# Patient Record
Sex: Male | Born: 1955 | Race: White | Hispanic: No
Health system: Southern US, Community
[De-identification: ages and names within clinical notes are randomized; demographics above are authoritative.]

---

## 2012-03-31 DIAGNOSIS — L219 Seborrheic dermatitis, unspecified: Secondary | ICD-10-CM | POA: Insufficient documentation

## 2012-09-22 DIAGNOSIS — I10 Essential (primary) hypertension: Secondary | ICD-10-CM | POA: Insufficient documentation

## 2012-09-22 DIAGNOSIS — L570 Actinic keratosis: Secondary | ICD-10-CM | POA: Insufficient documentation

## 2016-06-06 DIAGNOSIS — R209 Unspecified disturbances of skin sensation: Secondary | ICD-10-CM

## 2016-06-06 DIAGNOSIS — R739 Hyperglycemia, unspecified: Secondary | ICD-10-CM | POA: Insufficient documentation

## 2016-06-06 DIAGNOSIS — IMO0001 Reserved for inherently not codable concepts without codable children: Secondary | ICD-10-CM | POA: Insufficient documentation

## 2016-10-24 DIAGNOSIS — D472 Monoclonal gammopathy: Secondary | ICD-10-CM | POA: Insufficient documentation

## 2017-02-10 DIAGNOSIS — D899 Disorder involving the immune mechanism, unspecified: Secondary | ICD-10-CM | POA: Insufficient documentation

## 2017-02-10 DIAGNOSIS — G909 Disorder of the autonomic nervous system, unspecified: Secondary | ICD-10-CM

## 2017-03-06 DIAGNOSIS — G629 Polyneuropathy, unspecified: Secondary | ICD-10-CM | POA: Insufficient documentation

## 2017-04-16 DIAGNOSIS — G63 Polyneuropathy in diseases classified elsewhere: Secondary | ICD-10-CM | POA: Insufficient documentation

## 2017-04-16 DIAGNOSIS — I776 Arteritis, unspecified: Secondary | ICD-10-CM | POA: Insufficient documentation

## 2017-07-18 DIAGNOSIS — R7303 Prediabetes: Secondary | ICD-10-CM | POA: Insufficient documentation

## 2018-04-16 ENCOUNTER — Ambulatory Visit (INDEPENDENT_AMBULATORY_CARE_PROVIDER_SITE_OTHER): Payer: BLUE CROSS/BLUE SHIELD

## 2018-04-16 ENCOUNTER — Encounter: Payer: Self-pay | Admitting: Podiatry

## 2018-04-16 ENCOUNTER — Other Ambulatory Visit: Payer: Self-pay | Admitting: Podiatry

## 2018-04-16 ENCOUNTER — Ambulatory Visit: Payer: BLUE CROSS/BLUE SHIELD | Admitting: Podiatry

## 2018-04-16 VITALS — BP 160/92 | HR 72

## 2018-04-16 DIAGNOSIS — R2681 Unsteadiness on feet: Secondary | ICD-10-CM

## 2018-04-16 DIAGNOSIS — M79671 Pain in right foot: Secondary | ICD-10-CM

## 2018-04-16 DIAGNOSIS — M79672 Pain in left foot: Secondary | ICD-10-CM | POA: Diagnosis not present

## 2018-04-16 DIAGNOSIS — G629 Polyneuropathy, unspecified: Secondary | ICD-10-CM | POA: Diagnosis not present

## 2018-04-16 NOTE — Progress Notes (Signed)
Subjective:   Patient ID: Benjamin Price, male   DOB: 62 y.o.   MRN: [id number]   HPI Patient presents after having significant diagnosis of idiopathic peripheral neuropathy which is gotten worse and now extends to his knee.  He has had numerous tests done which have not been able to indicate complete cause but it appears to be immune related and patient has developed significant balance issues.  Patient utilizes AFO bracing but does not feel that this gives him the stability that he needs with certain types of activities and is interested in other bracing and also has severe pain at night and balance issues and wants to know whether physical therapy could be of benefit.  Patient does not smoke and would like to be active   Review of Systems  All other systems reviewed and are negative.       Objective:  Physical Exam Vitals signs and nursing note reviewed.  Constitutional:      Appearance: He is well-developed.  Pulmonary:     Effort: Pulmonary effort is normal.  Musculoskeletal: Normal range of motion.  Skin:    General: Skin is warm.  Neurological:     Mental Status: He is alert.     Vascular status was found to be diminished DP pulses right over left with cool foot right over left which appears to be autonomic.  Patient has discoloration of his feet diminished range of motion subtalar midtarsal joint and significant diminishment of muscle strength extensor flexor muscle groups.  Patient does have an abnormal gait pattern and does have instability and does have profound loss sharp dull and vibratory.  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  At this point I have recommended consultation with ped orthotist with consideration of balance bracing or possible hinged type AFO bracing.  I have also recommended physical therapy to try to see whether  balance strengthening and exercises could be of benefit with also consideration for neurogenic's treatment to try to help with his autonomic problem.  Patient will be reevaluated after he is gone through these modalities  X-rays indicate no indications of bony pathological issues with the feet with no osteoporosis noted or indications of arthritis or any form of fracture or stress fracture condition

## 2018-05-18 ENCOUNTER — Ambulatory Visit: Payer: BLUE CROSS/BLUE SHIELD | Admitting: Orthotics

## 2018-05-18 DIAGNOSIS — G629 Polyneuropathy, unspecified: Secondary | ICD-10-CM

## 2018-05-18 DIAGNOSIS — M79671 Pain in right foot: Secondary | ICD-10-CM

## 2018-05-18 DIAGNOSIS — M79672 Pain in left foot: Secondary | ICD-10-CM

## 2018-05-18 DIAGNOSIS — R2681 Unsteadiness on feet: Secondary | ICD-10-CM

## 2018-05-19 NOTE — Progress Notes (Signed)
After consultation with both patient and his PT (Mara-Benchmark); it was determined that any sort of bracing, other than the OTS bracing he currently has, would be of little use and perhaps even counter productive.   He does have significant balance issues, however balance braces would not help with his foot drop or muscle atrophy, especially right.  He is scheduled for a nerve block and Lynn Ito will reevaluate post to determine if he would benefit from any further DME.

## 2018-05-22 ENCOUNTER — Other Ambulatory Visit (HOSPITAL_COMMUNITY): Payer: Self-pay | Admitting: Oncology

## 2018-05-22 DIAGNOSIS — G609 Hereditary and idiopathic neuropathy, unspecified: Secondary | ICD-10-CM

## 2018-05-25 ENCOUNTER — Other Ambulatory Visit (HOSPITAL_COMMUNITY): Payer: Self-pay | Admitting: Oncology

## 2018-05-25 DIAGNOSIS — G609 Hereditary and idiopathic neuropathy, unspecified: Secondary | ICD-10-CM

## 2018-05-30 ENCOUNTER — Ambulatory Visit (HOSPITAL_COMMUNITY)
Admission: RE | Admit: 2018-05-30 | Discharge: 2018-05-30 | Disposition: A | Discharge status: Home / Self Care | Payer: BLUE CROSS/BLUE SHIELD | Source: Ambulatory Visit | Attending: Oncology | Admitting: Oncology

## 2018-05-30 DIAGNOSIS — G609 Hereditary and idiopathic neuropathy, unspecified: Secondary | ICD-10-CM

## 2019-11-16 IMAGING — MR MR HEAD W/O CM
9 of 10 series · 40 of 48 positions shown · non-contrast
Comparison: None.

CLINICAL DATA: Bilateral arm pain and tingling

EXAM:
MRI HEAD WITHOUT CONTRAST
TECHNIQUE: Multiplanar, multiecho pulse sequences of the brain and surrounding
structures were obtained without intravenous contrast.

[Series 3: T1 · sagittal · 5.0mm · 0.47mm/px · 1 of 23 slices shown]
[im 1/23]
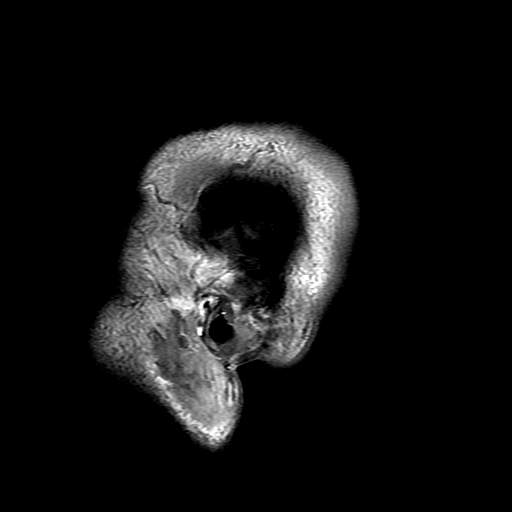

[Series 4: DWI · axial · 3.0mm · 1.09mm/px · z∈[-36,+104]mm · 8 of 96 slices shown (1 of 4)]
[im 1/96]
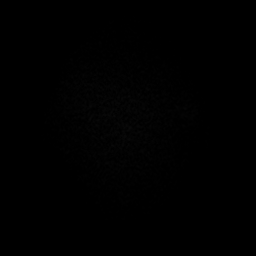
[im 11/96]
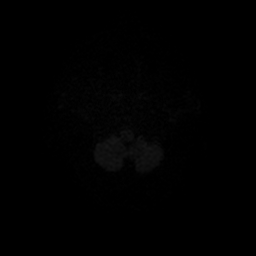
[im 32/96]
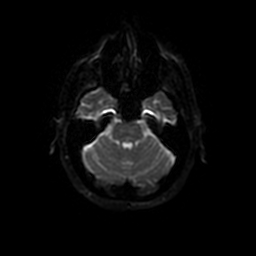
[im 43/96]
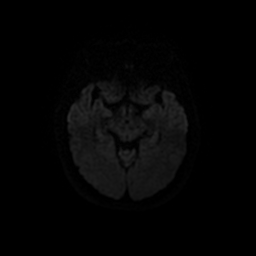
[im 53/96]
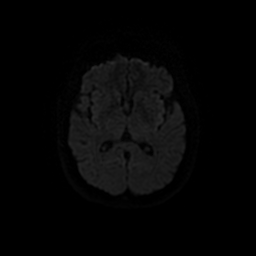
[im 64/96]
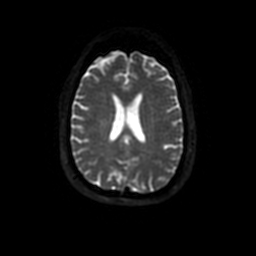
[im 85/96]
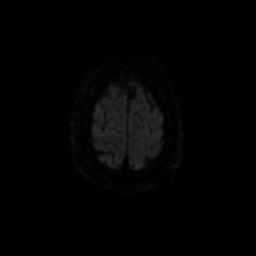
[im 96/96]
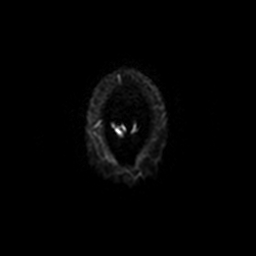

[Series 5: T2 · axial · 5.0mm · 0.43mm/px · z∈[-44,+102]mm · 2 of 22 slices shown (1 of 2)]
[im 1/22]
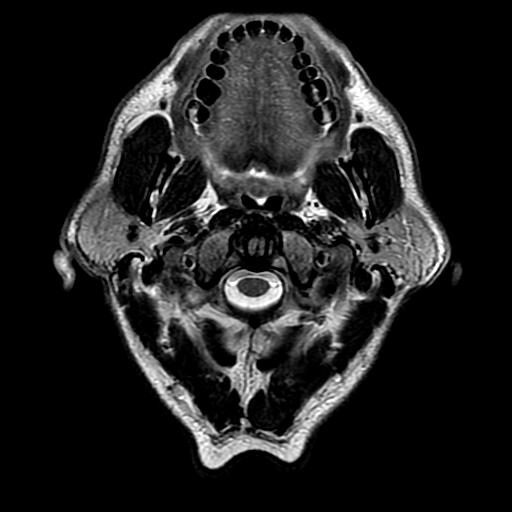
[im 22/22]
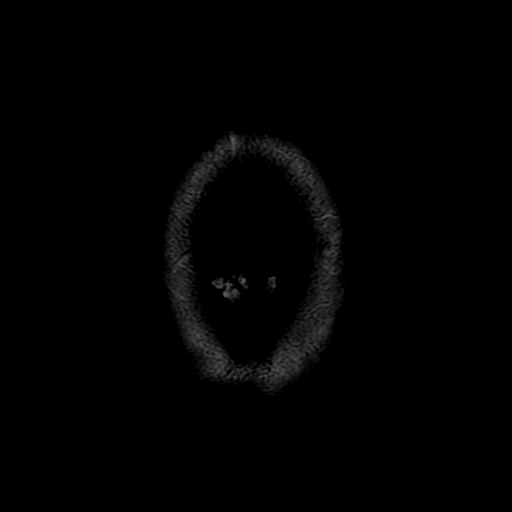

[Series 6: FLAIR · axial · 3.0mm · 0.43mm/px · z∈[-46,+103]mm · 3 of 26 slices shown]
[im 1/26]
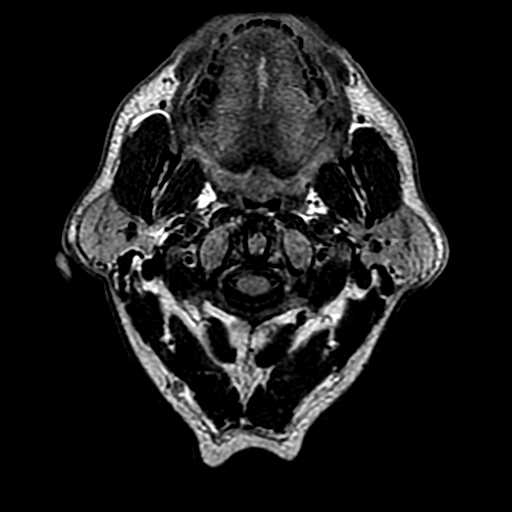
[im 13/26]
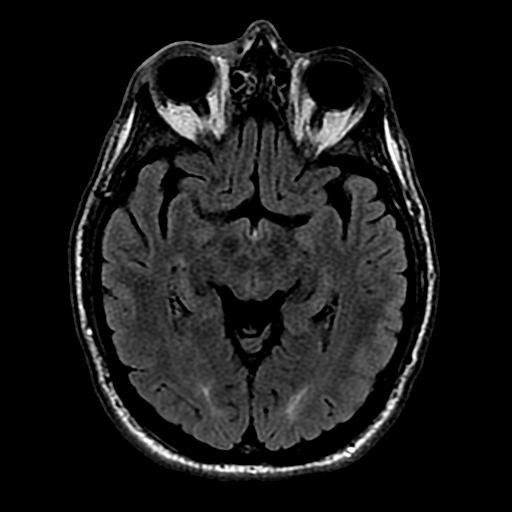
[im 26/26]
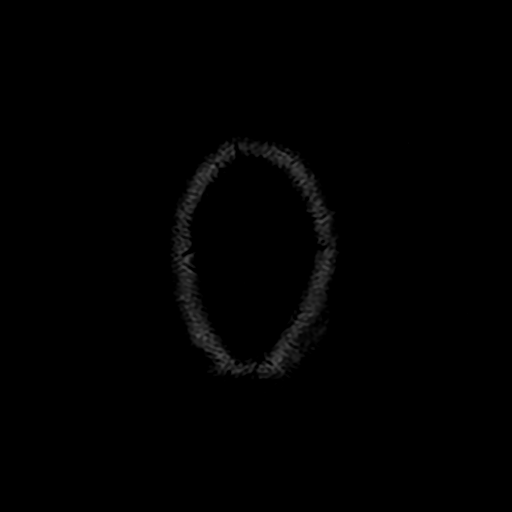

[Series 7: DWI · coronal · 3.0mm · 1.09mm/px · 11 of 112 slices shown (2 of 4)]
[im 1/112]
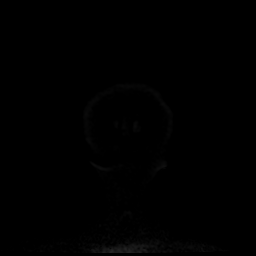
[im 12/112]
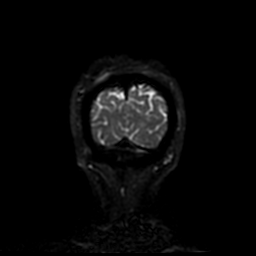
[im 23/112]
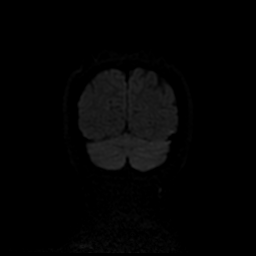
[im 34/112]
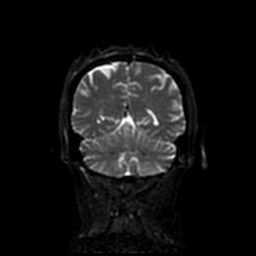
[im 45/112]
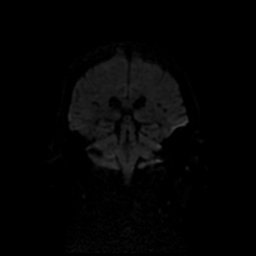
[im 56/112]
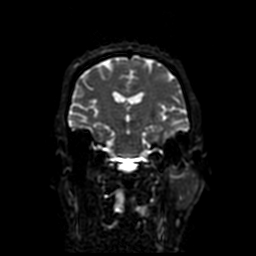
[im 67/112]
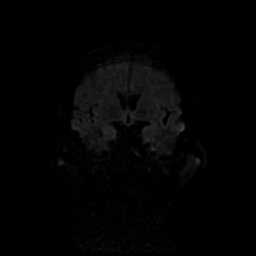
[im 78/112]
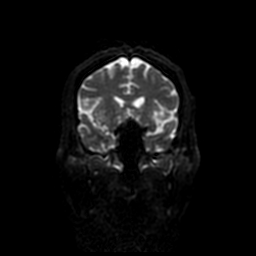
[im 89/112]
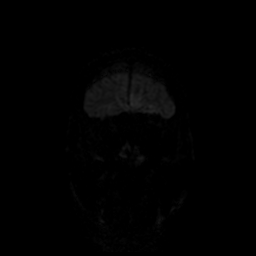
[im 100/112]
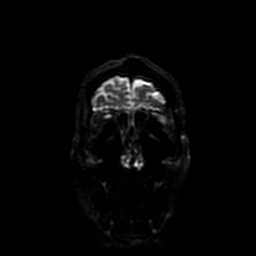
[im 112/112]
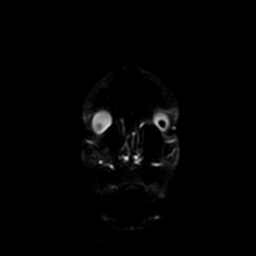

[Series 8: ax mpgr · axial · 5.0mm · 0.43mm/px · 1 of 22 slices shown]
[im 1/22]
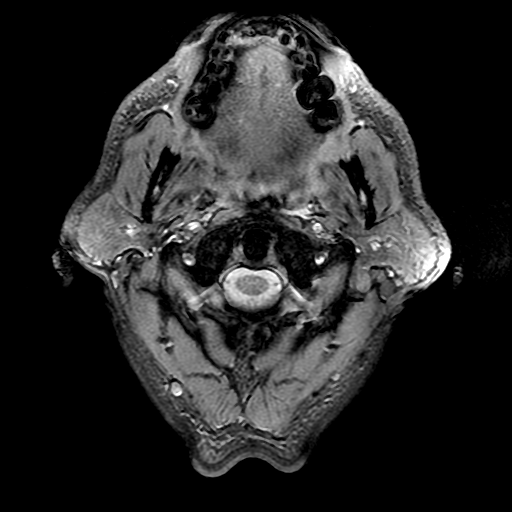

[Series 10: T2 · coronal · 5.0mm · 0.45mm/px · 3 of 26 slices shown (2 of 2)]
[im 1/26]
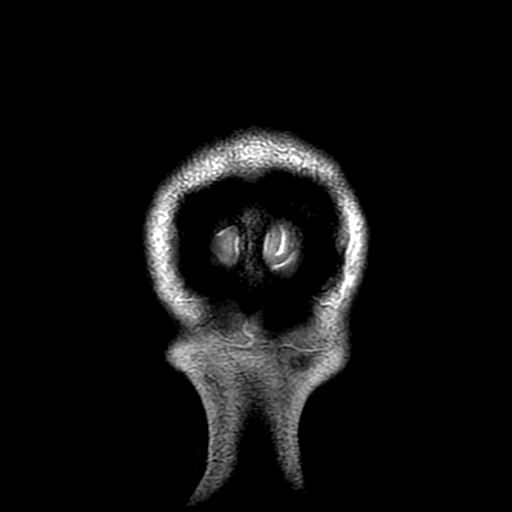
[im 13/26]
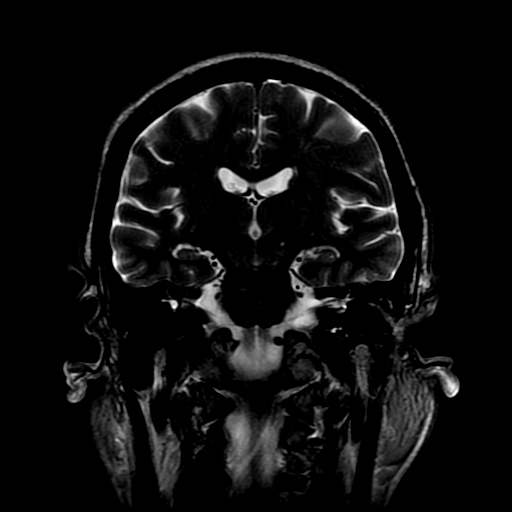
[im 26/26]
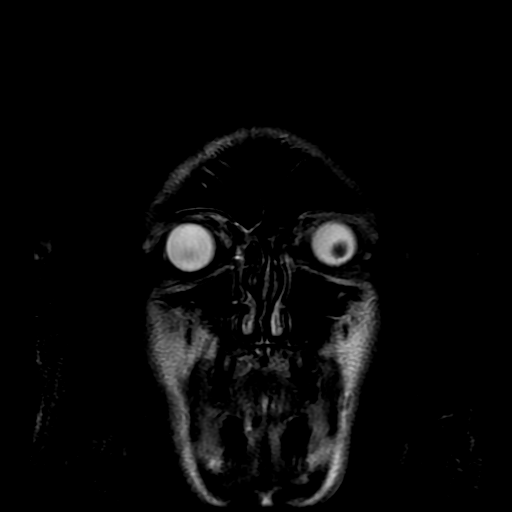

[Series 400: DWI · axial · 3.0mm · 1.09mm/px · z∈[-36,+104]mm · 5 of 48 slices shown (3 of 4)]
[im 1/48]
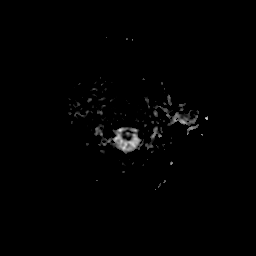
[im 12/48]
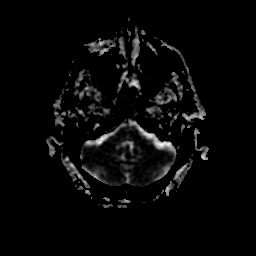
[im 24/48]
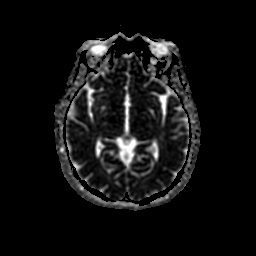
[im 36/48]
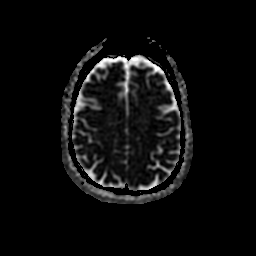
[im 48/48]
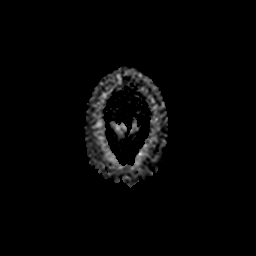

[Series 700: DWI · coronal · 3.0mm · 1.09mm/px · 6 of 56 slices shown (4 of 4)]
[im 1/56]
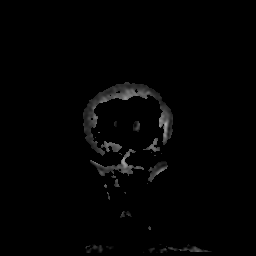
[im 12/56]
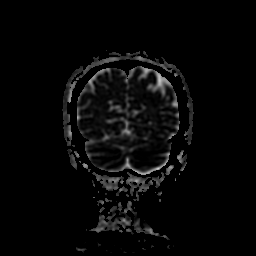
[im 23/56]
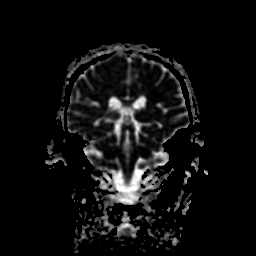
[im 34/56]
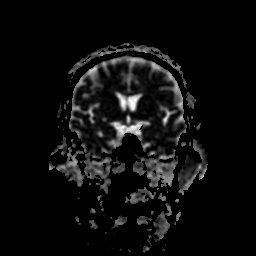
[im 45/56]
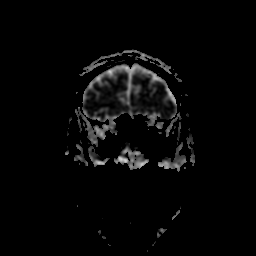
[im 56/56]
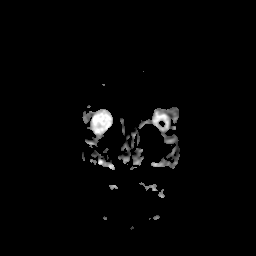

[40 of 48 positions shown; findings below may reference images not displayed]

FINDINGS: Brain: No acute infarction, hemorrhage, hydrocephalus, or mass
effect. CSF intensity collection at the left CP angle cistern
measuring 14 mm, without significant mass effect.

Vascular: Major flow voids are preserved.

Skull and upper cervical spine: Negative for marrow lesion

Sinuses/Orbits: Minor opacification of left mastoid air spaces with
negative nasopharynx, incidental given the history. Negative orbits
IMPRESSION: 1. No explanation for symptoms.  Normal appearance of the brain.
2. Small arachnoid cyst at the left CP angle cistern without
cerebellar mass effect.

## 2020-06-09 DIAGNOSIS — L570 Actinic keratosis: Secondary | ICD-10-CM | POA: Diagnosis not present

## 2020-06-09 DIAGNOSIS — L814 Other melanin hyperpigmentation: Secondary | ICD-10-CM | POA: Diagnosis not present

## 2020-06-09 DIAGNOSIS — D225 Melanocytic nevi of trunk: Secondary | ICD-10-CM | POA: Diagnosis not present

## 2020-06-09 DIAGNOSIS — D1801 Hemangioma of skin and subcutaneous tissue: Secondary | ICD-10-CM | POA: Diagnosis not present

## 2020-06-09 DIAGNOSIS — D485 Neoplasm of uncertain behavior of skin: Secondary | ICD-10-CM | POA: Diagnosis not present

## 2020-06-09 DIAGNOSIS — C44329 Squamous cell carcinoma of skin of other parts of face: Secondary | ICD-10-CM | POA: Diagnosis not present

## 2020-06-27 DIAGNOSIS — C44329 Squamous cell carcinoma of skin of other parts of face: Secondary | ICD-10-CM | POA: Diagnosis not present

## 2020-06-27 DIAGNOSIS — L244 Irritant contact dermatitis due to drugs in contact with skin: Secondary | ICD-10-CM | POA: Diagnosis not present

## 2020-07-05 DIAGNOSIS — Z9484 Stem cells transplant status: Secondary | ICD-10-CM | POA: Diagnosis not present

## 2020-07-05 DIAGNOSIS — G63 Polyneuropathy in diseases classified elsewhere: Secondary | ICD-10-CM | POA: Diagnosis not present

## 2020-07-05 DIAGNOSIS — G894 Chronic pain syndrome: Secondary | ICD-10-CM | POA: Diagnosis not present

## 2020-07-05 DIAGNOSIS — E8809 Other disorders of plasma-protein metabolism, not elsewhere classified: Secondary | ICD-10-CM | POA: Diagnosis not present

## 2020-08-11 DIAGNOSIS — Z85828 Personal history of other malignant neoplasm of skin: Secondary | ICD-10-CM | POA: Diagnosis not present

## 2020-08-11 DIAGNOSIS — D1801 Hemangioma of skin and subcutaneous tissue: Secondary | ICD-10-CM | POA: Diagnosis not present

## 2020-08-11 DIAGNOSIS — L821 Other seborrheic keratosis: Secondary | ICD-10-CM | POA: Diagnosis not present

## 2020-08-11 DIAGNOSIS — D485 Neoplasm of uncertain behavior of skin: Secondary | ICD-10-CM | POA: Diagnosis not present

## 2020-08-11 DIAGNOSIS — L578 Other skin changes due to chronic exposure to nonionizing radiation: Secondary | ICD-10-CM | POA: Diagnosis not present

## 2020-08-11 DIAGNOSIS — L905 Scar conditions and fibrosis of skin: Secondary | ICD-10-CM | POA: Diagnosis not present

## 2020-08-11 DIAGNOSIS — D2339 Other benign neoplasm of skin of other parts of face: Secondary | ICD-10-CM | POA: Diagnosis not present

## 2020-08-11 DIAGNOSIS — L570 Actinic keratosis: Secondary | ICD-10-CM | POA: Diagnosis not present

## 2020-08-11 DIAGNOSIS — D2261 Melanocytic nevi of right upper limb, including shoulder: Secondary | ICD-10-CM | POA: Diagnosis not present

## 2020-09-13 DIAGNOSIS — M6208 Separation of muscle (nontraumatic), other site: Secondary | ICD-10-CM | POA: Diagnosis not present

## 2020-09-27 DIAGNOSIS — E8809 Other disorders of plasma-protein metabolism, not elsewhere classified: Secondary | ICD-10-CM | POA: Diagnosis not present

## 2020-09-27 DIAGNOSIS — E291 Testicular hypofunction: Secondary | ICD-10-CM | POA: Diagnosis not present

## 2020-09-27 DIAGNOSIS — Z9484 Stem cells transplant status: Secondary | ICD-10-CM | POA: Diagnosis not present

## 2020-09-27 DIAGNOSIS — G63 Polyneuropathy in diseases classified elsewhere: Secondary | ICD-10-CM | POA: Diagnosis not present

## 2020-09-27 DIAGNOSIS — G894 Chronic pain syndrome: Secondary | ICD-10-CM | POA: Diagnosis not present

## 2020-11-02 DIAGNOSIS — Z79899 Other long term (current) drug therapy: Secondary | ICD-10-CM | POA: Diagnosis not present

## 2020-11-02 DIAGNOSIS — Z52011 Autologous donor, stem cells: Secondary | ICD-10-CM | POA: Diagnosis not present

## 2020-11-02 DIAGNOSIS — G893 Neoplasm related pain (acute) (chronic): Secondary | ICD-10-CM | POA: Diagnosis not present

## 2020-11-02 DIAGNOSIS — G894 Chronic pain syndrome: Secondary | ICD-10-CM | POA: Diagnosis not present

## 2020-11-02 DIAGNOSIS — Z882 Allergy status to sulfonamides status: Secondary | ICD-10-CM | POA: Diagnosis not present

## 2020-11-02 DIAGNOSIS — I10 Essential (primary) hypertension: Secondary | ICD-10-CM | POA: Diagnosis not present

## 2020-11-02 DIAGNOSIS — T451X5A Adverse effect of antineoplastic and immunosuppressive drugs, initial encounter: Secondary | ICD-10-CM | POA: Diagnosis not present

## 2020-11-02 DIAGNOSIS — G63 Polyneuropathy in diseases classified elsewhere: Secondary | ICD-10-CM | POA: Diagnosis not present

## 2020-11-02 DIAGNOSIS — Z9484 Stem cells transplant status: Secondary | ICD-10-CM | POA: Diagnosis not present

## 2020-11-02 DIAGNOSIS — E8809 Other disorders of plasma-protein metabolism, not elsewhere classified: Secondary | ICD-10-CM | POA: Diagnosis not present

## 2020-11-02 DIAGNOSIS — E291 Testicular hypofunction: Secondary | ICD-10-CM | POA: Diagnosis not present

## 2020-11-17 DIAGNOSIS — L8992 Pressure ulcer of unspecified site, stage 2: Secondary | ICD-10-CM | POA: Diagnosis not present

## 2020-11-29 DIAGNOSIS — Z23 Encounter for immunization: Secondary | ICD-10-CM | POA: Diagnosis not present
